# Patient Record
Sex: Male | Born: 1940 | Race: White | Hispanic: No | Marital: Married | Smoking: Former smoker
Health system: Southern US, Community
[De-identification: ages and names within clinical notes are randomized; demographics above are authoritative.]

## PROBLEM LIST (undated history)

## (undated) DIAGNOSIS — C801 Malignant (primary) neoplasm, unspecified: Secondary | ICD-10-CM

---

## 2004-03-10 ENCOUNTER — Ambulatory Visit: Payer: Self-pay | Admitting: Family Medicine

## 2007-03-28 ENCOUNTER — Ambulatory Visit: Payer: Self-pay | Admitting: Internal Medicine

## 2016-09-24 ENCOUNTER — Other Ambulatory Visit: Payer: Self-pay | Admitting: Family Medicine

## 2016-09-24 DIAGNOSIS — I499 Cardiac arrhythmia, unspecified: Secondary | ICD-10-CM | POA: Insufficient documentation

## 2016-09-24 DIAGNOSIS — J301 Allergic rhinitis due to pollen: Secondary | ICD-10-CM | POA: Insufficient documentation

## 2016-09-24 DIAGNOSIS — W57XXXA Bitten or stung by nonvenomous insect and other nonvenomous arthropods, initial encounter: Secondary | ICD-10-CM | POA: Insufficient documentation

## 2016-09-24 DIAGNOSIS — Z87891 Personal history of nicotine dependence: Secondary | ICD-10-CM

## 2016-09-24 DIAGNOSIS — Z136 Encounter for screening for cardiovascular disorders: Secondary | ICD-10-CM | POA: Insufficient documentation

## 2016-09-27 DIAGNOSIS — J61 Pneumoconiosis due to asbestos and other mineral fibers: Secondary | ICD-10-CM | POA: Insufficient documentation

## 2016-09-27 DIAGNOSIS — Z87891 Personal history of nicotine dependence: Secondary | ICD-10-CM | POA: Insufficient documentation

## 2016-09-27 DIAGNOSIS — C44300 Unspecified malignant neoplasm of skin of unspecified part of face: Secondary | ICD-10-CM | POA: Insufficient documentation

## 2016-09-29 DIAGNOSIS — E786 Lipoprotein deficiency: Secondary | ICD-10-CM | POA: Insufficient documentation

## 2016-10-05 ENCOUNTER — Ambulatory Visit
Admission: RE | Admit: 2016-10-05 | Discharge: 2016-10-05 | Disposition: A | Discharge status: Home / Self Care | Payer: Medicare Other | Source: Ambulatory Visit | Attending: Family Medicine | Admitting: Family Medicine

## 2016-10-05 DIAGNOSIS — Z87891 Personal history of nicotine dependence: Secondary | ICD-10-CM | POA: Insufficient documentation

## 2016-10-05 DIAGNOSIS — I700 Atherosclerosis of aorta: Secondary | ICD-10-CM | POA: Insufficient documentation

## 2016-10-18 ENCOUNTER — Other Ambulatory Visit: Payer: Self-pay | Admitting: Specialist

## 2016-10-18 DIAGNOSIS — R0602 Shortness of breath: Secondary | ICD-10-CM

## 2016-10-22 ENCOUNTER — Ambulatory Visit
Admission: RE | Admit: 2016-10-22 | Discharge: 2016-10-22 | Disposition: A | Discharge status: Home / Self Care | Payer: Medicare Other | Source: Ambulatory Visit | Attending: Specialist | Admitting: Specialist

## 2016-10-22 DIAGNOSIS — K8020 Calculus of gallbladder without cholecystitis without obstruction: Secondary | ICD-10-CM | POA: Diagnosis not present

## 2016-10-22 DIAGNOSIS — R918 Other nonspecific abnormal finding of lung field: Secondary | ICD-10-CM | POA: Insufficient documentation

## 2016-10-22 DIAGNOSIS — J479 Bronchiectasis, uncomplicated: Secondary | ICD-10-CM | POA: Diagnosis not present

## 2016-10-22 DIAGNOSIS — R0602 Shortness of breath: Secondary | ICD-10-CM

## 2017-05-20 ENCOUNTER — Other Ambulatory Visit: Payer: Self-pay | Admitting: Specialist

## 2017-05-20 DIAGNOSIS — J920 Pleural plaque with presence of asbestos: Secondary | ICD-10-CM

## 2017-05-20 DIAGNOSIS — R918 Other nonspecific abnormal finding of lung field: Secondary | ICD-10-CM

## 2017-09-26 ENCOUNTER — Ambulatory Visit
Admission: RE | Admit: 2017-09-26 | Discharge: 2017-09-26 | Disposition: A | Discharge status: Home / Self Care | Payer: Medicare Other | Source: Ambulatory Visit | Attending: Specialist | Admitting: Specialist

## 2017-09-26 ENCOUNTER — Encounter (INDEPENDENT_AMBULATORY_CARE_PROVIDER_SITE_OTHER): Payer: Self-pay

## 2017-09-26 DIAGNOSIS — R918 Other nonspecific abnormal finding of lung field: Secondary | ICD-10-CM | POA: Diagnosis present

## 2017-09-26 DIAGNOSIS — R911 Solitary pulmonary nodule: Secondary | ICD-10-CM | POA: Insufficient documentation

## 2017-09-26 DIAGNOSIS — J479 Bronchiectasis, uncomplicated: Secondary | ICD-10-CM | POA: Insufficient documentation

## 2017-09-26 DIAGNOSIS — I700 Atherosclerosis of aorta: Secondary | ICD-10-CM | POA: Diagnosis not present

## 2017-09-26 DIAGNOSIS — I2510 Atherosclerotic heart disease of native coronary artery without angina pectoris: Secondary | ICD-10-CM | POA: Diagnosis not present

## 2017-09-26 DIAGNOSIS — J920 Pleural plaque with presence of asbestos: Secondary | ICD-10-CM | POA: Diagnosis not present

## 2018-06-27 DIAGNOSIS — Z85828 Personal history of other malignant neoplasm of skin: Secondary | ICD-10-CM | POA: Insufficient documentation

## 2019-07-02 ENCOUNTER — Other Ambulatory Visit: Payer: Self-pay | Admitting: Specialist

## 2019-07-02 DIAGNOSIS — R0609 Other forms of dyspnea: Secondary | ICD-10-CM

## 2019-07-02 DIAGNOSIS — J61 Pneumoconiosis due to asbestos and other mineral fibers: Secondary | ICD-10-CM

## 2019-07-02 DIAGNOSIS — J449 Chronic obstructive pulmonary disease, unspecified: Secondary | ICD-10-CM

## 2019-07-24 ENCOUNTER — Ambulatory Visit
Admission: RE | Admit: 2019-07-24 | Discharge: 2019-07-24 | Disposition: A | Discharge status: Home / Self Care | Payer: Medicare Other | Source: Ambulatory Visit | Attending: Specialist | Admitting: Specialist

## 2019-07-24 ENCOUNTER — Other Ambulatory Visit: Payer: Self-pay

## 2019-07-24 DIAGNOSIS — J61 Pneumoconiosis due to asbestos and other mineral fibers: Secondary | ICD-10-CM | POA: Insufficient documentation

## 2019-07-24 DIAGNOSIS — J449 Chronic obstructive pulmonary disease, unspecified: Secondary | ICD-10-CM | POA: Insufficient documentation

## 2019-07-24 DIAGNOSIS — R0609 Other forms of dyspnea: Secondary | ICD-10-CM

## 2019-07-24 DIAGNOSIS — R0600 Dyspnea, unspecified: Secondary | ICD-10-CM | POA: Insufficient documentation

## 2019-07-27 ENCOUNTER — Inpatient Hospital Stay: Payer: Medicare Other

## 2019-07-27 ENCOUNTER — Inpatient Hospital Stay: Payer: Medicare Other | Attending: Oncology | Admitting: Oncology

## 2019-07-27 ENCOUNTER — Other Ambulatory Visit: Payer: Self-pay

## 2019-07-27 ENCOUNTER — Encounter: Payer: Self-pay | Admitting: Oncology

## 2019-07-27 VITALS — BP 195/84 | HR 57 | Temp 96.4°F | Ht 67.0 in | Wt 201.0 lb

## 2019-07-27 DIAGNOSIS — R590 Localized enlarged lymph nodes: Secondary | ICD-10-CM | POA: Insufficient documentation

## 2019-07-27 DIAGNOSIS — J61 Pneumoconiosis due to asbestos and other mineral fibers: Secondary | ICD-10-CM | POA: Diagnosis not present

## 2019-07-27 DIAGNOSIS — J449 Chronic obstructive pulmonary disease, unspecified: Secondary | ICD-10-CM | POA: Insufficient documentation

## 2019-07-27 DIAGNOSIS — C8519 Unspecified B-cell lymphoma, extranodal and solid organ sites: Secondary | ICD-10-CM | POA: Diagnosis not present

## 2019-07-27 DIAGNOSIS — R9389 Abnormal findings on diagnostic imaging of other specified body structures: Secondary | ICD-10-CM

## 2019-07-27 DIAGNOSIS — R918 Other nonspecific abnormal finding of lung field: Secondary | ICD-10-CM | POA: Diagnosis present

## 2019-07-27 NOTE — Progress Notes (Signed)
Patient is here today to establish care. Patient stated that he had been getting SOB on exertion.

## 2019-08-01 ENCOUNTER — Encounter: Payer: Self-pay | Admitting: Oncology

## 2019-08-01 NOTE — Progress Notes (Signed)
Hematology/Oncology Consult note [facility] [phone] [phone]  Patient Care Team: Lynnell Jude, MD as PCP - General (Family Medicine)   Name of the patient: Lee Freeman  [place]:[id number]  1940/05/24    Reason for referral-abnormal CT chest   Referring physician-Dr. Lanney Gins    Date of visit: 08/01/19   History of presenting illness- Patient is a 79 year old male with past medical history significant for asbestosis.  He was following up with Dr. For that.  He has stage II COPD and changes of asbestosis noted in his CT scans before.  More recently patient underwent CT chest without contrast on 07/24/2019 which showed changes of asbestosis related pleural disease which was stable.  However he was noted to have a new soft tissue nodularity within the abdominal omentum that was new as compared to 2018.  Metastatic disease is not excluded and CT scan was recommended.  Patient is therefore referred to [place] for the same  Patient is doing relatively well for his age.  Reports that his appetite and weight have remained stable.  Denies any abdominal pain.  Patient does not use any baseline oxygen  ECOG PS- 1  Pain scale- 0   Review of systems- Review of Systems  Constitutional: Positive for malaise/fatigue. Negative for chills, fever and weight loss.  HENT: Negative for congestion, ear discharge and nosebleeds.   Eyes: Negative for blurred vision.  Respiratory: Negative for cough, hemoptysis, sputum production, shortness of breath and wheezing.   Cardiovascular: Negative for chest pain, palpitations, orthopnea and claudication.  Gastrointestinal: Negative for abdominal pain, blood in stool, constipation, diarrhea, heartburn, melena, nausea and vomiting.  Genitourinary: Negative for dysuria, flank pain, frequency, hematuria and urgency.  Musculoskeletal: Negative for back pain, joint pain and myalgias.  Skin: Negative for rash.  Neurological:  Negative for dizziness, tingling, focal weakness, seizures, weakness and headaches.  Endo/Heme/Allergies: Does not bruise/bleed easily.  Psychiatric/Behavioral: Negative for depression and suicidal ideas. The patient does not have insomnia.     Allergies  Allergen Reactions  . Amoxicillin Diarrhea    Other reaction(s): Abdominal Pain  . Clindamycin Hcl Diarrhea    Other reaction(s): Abdominal Pain  . Prednisone     Other reaction(s): Hallucination    Patient Active Problem List   Diagnosis Date Noted  . Personal history of other malignant neoplasm of skin 06/27/2018  . Low HDL (under 40) 09/29/2016  . Asbestosis ([place]) 09/27/2016  . Former cigarette smoker 09/27/2016  . Skin cancer of face 09/27/2016  . Irregular heart beat 09/24/2016  . Screening for AAA (abdominal aortic aneurysm) 09/24/2016  . Seasonal allergic rhinitis due to pollen 09/24/2016  . Tick bite 09/24/2016     History reviewed. No pertinent past medical history.   History reviewed. No pertinent surgical history.  Social History   Socioeconomic History  . Marital status: Married    Spouse name: Not on file  . Number of children: Not on file  . Years of education: Not on file  . Highest education level: Not on file  Occupational History  . Not on file  Tobacco Use  . Smoking status: Former Smoker    Packs/day: 1.00    Years: 20.00    Pack years: 20.00    Types: Cigarettes    Quit date: 01/04/1970    Years since quitting: 49.6  . Smokeless tobacco: Former Systems developer    Types: [place] date: 01/04/1970  Substance and Sexual Activity  . Alcohol use:  Not Currently  . Drug use: Not on file  . Sexual activity: Not on file  Other Topics Concern  . Not on file  Social History Narrative  . Not on file   Social Determinants of Health   Financial Resource Strain:   . Difficulty of Paying Living Expenses:   Food Insecurity:   . Worried About Charity fundraiser in the Last Year:   . Arboriculturist in  the Last Year:   Transportation Needs:   . Film/video editor (Medical):   Marland Kitchen Lack of Transportation (Non-Medical):   Physical Activity:   . Days of Exercise per Week:   . Minutes of Exercise per Session:   Stress:   . Feeling of Stress :   Social Connections:   . Frequency of Communication with Friends and Family:   . Frequency of Social Gatherings with Friends and Family:   . Attends Religious Services:   . Active Member of Clubs or Organizations:   . Attends Archivist Meetings:   Marland Kitchen Marital Status:   Intimate Partner Violence:   . Fear of Current or Ex-Partner:   . Emotionally Abused:   Marland Kitchen Physically Abused:   . Sexually Abused:      Family History  Problem Relation Age of Onset  . Colon cancer Mother      Current Outpatient Medications:  .  fexofenadine (ALLEGRA) 180 MG tablet, Take 1 tablet by mouth as needed., Disp: , Rfl:  .  fluticasone (FLONASE) 50 MCG/ACT nasal spray, Place 2 sprays into the nose once as needed., Disp: , Rfl:  .  umeclidinium-vilanterol (ANORO ELLIPTA) 62.5-25 MCG/INH AEPB, Inhale 1 puff into the lungs 1 day or 1 dose., Disp: , Rfl:    Physical exam:  Vitals:   07/27/19 1050  BP: (!) 195/84  Pulse: (!) 57  Temp: (!) 96.4 F (35.8 C)  TempSrc: Tympanic  SpO2: 100%  Weight: 201 lb (91.2 kg)  Height: 5\' 7"  (1.702 m)   Physical Exam HENT:     Head: Normocephalic and atraumatic.  Eyes:     Pupils: Pupils are equal, round, and reactive to light.  Cardiovascular:     Rate and Rhythm: Normal rate and regular rhythm.     Heart sounds: Normal heart sounds.  Pulmonary:     Effort: Pulmonary effort is normal.     Breath sounds: Normal breath sounds.  Abdominal:     General: Bowel sounds are normal. There is no distension.     Palpations: Abdomen is soft.     Tenderness: There is no abdominal tenderness.  Musculoskeletal:     Cervical back: Normal range of motion.  Skin:    General: Skin is warm and dry.  Neurological:      Mental Status: He is alert and oriented to person, place, and time.        No flowsheet data found. No flowsheet data found.  No images are attached to the encounter.  CT CHEST WO CONTRAST  Result Date: 07/24/2019 CLINICAL DATA:  Dyspnea on exertion. History of asbestosis. COPD. EXAM: CT CHEST WITHOUT CONTRAST TECHNIQUE: Multidetector CT imaging of the chest was performed following the standard protocol without IV contrast. COMPARISON:  09/26/2017 FINDINGS: Cardiovascular: Advanced aortic and branch vessel atherosclerosis. Mild cardiomegaly, without pericardial effusion. Multivessel coronary artery atherosclerosis. Aortic valve calcification. Mediastinum/Nodes: Calcified mediastinal nodes are likely related to old granulomatous disease. Hilar regions poorly evaluated without intravenous contrast. Lungs/Pleura: No pleural fluid. Redemonstration of bilateral  primarily calcified pleural plaques. Right hemidiaphragm eventration laterally. Moderate centrilobular emphysema. Scattered calcified granulomas. Mild nodularity just anterior to a calcified granuloma in the posterior right upper lobe on 35/3 similar and can be presumed benign. Redemonstration of areas of mild bibasilar ground-glass opacity, architectural distortion, and traction bronchiectasis/bronchiolectasis. Upper Abdomen: Multiple gallstones. 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 Normal imaged portions of the spleen, stomach, adrenal glands, right kidney. Fatty replacement throughout the pancreas. Soft tissue nodularity within the omentum including at 1.1 cm on 143/2. This area was not imaged on the most recent exam, but is new since 10/22/2016. Gastrohepatic ligament node measures 1.0 cm on 134/2 and is similar to on the prior. Musculoskeletal: Moderate upper and midthoracic spondylosis. IMPRESSION: 1.Asbestos related pleural disease and asbestosis, as before. 2. Soft tissue nodularity within the abdominal omentum, new since 10/22/2016. Cannot  exclude metastatic disease from an unknown primary. Consider dedicated abdominal pelvic CT. 3. Cholelithiasis 4. Aortic atherosclerosis (ICD10-I70.0), coronary artery atherosclerosis and emphysema (ICD10-J43.9). These results will be called to the ordering clinician or representative by the Radiologist Assistant, and communication documented in the PACS or [employer]. Electronically Signed   By: Abigail Miyamoto M.D.   On: 07/24/2019 14:02    Assessment and plan- Patient is a 79 y.o. male referred for abnormal CT chest  I have reviewed CT chest images independently and discussed findings with the patient.  Patient noted to have changes of asbestosis which have relatively remained stable.  However there is new nodularity seen within the abdominal omentum and it is unclear if this is malignancy or some other etiology.  I will proceed with a CT abdomen pelvis with contrast at this time.  I will review his case at tumor board the following week and discuss about next steps based on the discussion  Patient and his wife verbalized understanding of the plan   Thank you for this kind referral and the opportunity to participate in the care of this  Patient   Visit Diagnosis 1. Abnormal CT scan, chest     Dr. Randa Evens, MD, MPH [facility] [id number] 08/01/2019

## 2019-08-03 ENCOUNTER — Inpatient Hospital Stay: Payer: Medicare Other

## 2019-08-03 ENCOUNTER — Other Ambulatory Visit: Payer: Self-pay

## 2019-08-03 ENCOUNTER — Other Ambulatory Visit: Payer: Self-pay | Admitting: Oncology

## 2019-08-03 ENCOUNTER — Ambulatory Visit
Admission: RE | Admit: 2019-08-03 | Discharge: 2019-08-03 | Disposition: A | Discharge status: Home / Self Care | Payer: Medicare Other | Source: Ambulatory Visit | Attending: Oncology | Admitting: Oncology

## 2019-08-03 DIAGNOSIS — K668 Other specified disorders of peritoneum: Secondary | ICD-10-CM

## 2019-08-03 DIAGNOSIS — R9389 Abnormal findings on diagnostic imaging of other specified body structures: Secondary | ICD-10-CM | POA: Diagnosis not present

## 2019-08-03 DIAGNOSIS — C8519 Unspecified B-cell lymphoma, extranodal and solid organ sites: Secondary | ICD-10-CM | POA: Diagnosis not present

## 2019-08-03 HISTORY — DX: Malignant (primary) neoplasm, unspecified: C80.1

## 2019-08-03 LAB — COMPREHENSIVE METABOLIC PANEL
ALT: 39 U/L (ref 0–44)
AST: 30 U/L (ref 15–41)
Albumin: 4.1 g/dL (ref 3.5–5.0)
Alkaline Phosphatase: 92 U/L (ref 38–126)
Anion gap: 8 (ref 5–15)
BUN: 16 mg/dL (ref 8–23)
CO2: 25 mmol/L (ref 22–32)
Calcium: 9 mg/dL (ref 8.9–10.3)
Chloride: 105 mmol/L (ref 98–111)
Creatinine, Ser: 1.07 mg/dL (ref 0.61–1.24)
GFR calc Af Amer: >60 mL/min (ref >60)
GFR calc non Af Amer: >60 mL/min (ref >60)
Glucose, Bld: 124 mg/dL — ABNORMAL HIGH (ref 70–99)
Potassium: 3.9 mmol/L (ref 3.5–5.1)
Sodium: 138 mmol/L (ref 135–145)
Total Bilirubin: 0.6 mg/dL (ref 0.3–1.2)
Total Protein: 7.4 g/dL (ref 6.5–8.1)

## 2019-08-03 LAB — CBC
HCT: 42.5 % (ref 39.0–52.0)
Hemoglobin: 14.8 g/dL (ref 13.0–17.0)
MCH: 31 pg (ref 26.0–34.0)
MCHC: 34.8 g/dL (ref 30.0–36.0)
MCV: 88.9 fL (ref 80.0–100.0)
Platelets: 215 10*3/uL (ref 150–400)
RBC: 4.78 MIL/uL (ref 4.22–5.81)
RDW: 12.8 % (ref 11.5–15.5)
WBC: 5.4 10*3/uL (ref 4.0–10.5)
nRBC: 0 % (ref 0.0–0.2)

## 2019-08-03 MED ORDER — IOHEXOL 300 MG/ML  SOLN
100.0000 mL | Freq: Once | INTRAMUSCULAR | Status: AC | PRN
  Administered 2019-08-03: 100 mL via INTRAVENOUS

## 2019-08-06 ENCOUNTER — Other Ambulatory Visit: Payer: Self-pay

## 2019-08-07 ENCOUNTER — Encounter: Payer: Self-pay | Admitting: Oncology

## 2019-08-07 NOTE — Progress Notes (Signed)
Spoke with Rose patients wife and she is wanting [place] to get the procedure scheduled for Omental Mass CT BIopsy

## 2019-08-09 ENCOUNTER — Other Ambulatory Visit: Payer: Medicare Other

## 2019-08-09 ENCOUNTER — Other Ambulatory Visit: Payer: Self-pay

## 2019-08-09 ENCOUNTER — Inpatient Hospital Stay (HOSPITAL_BASED_OUTPATIENT_CLINIC_OR_DEPARTMENT_OTHER): Payer: Medicare Other | Admitting: Oncology

## 2019-08-09 VITALS — BP 203/92 | HR 63 | Temp 97.8°F | Ht 67.0 in | Wt 191.0 lb

## 2019-08-09 DIAGNOSIS — K668 Other specified disorders of peritoneum: Secondary | ICD-10-CM

## 2019-08-09 DIAGNOSIS — Z7189 Other specified counseling: Secondary | ICD-10-CM

## 2019-08-09 DIAGNOSIS — C8519 Unspecified B-cell lymphoma, extranodal and solid organ sites: Secondary | ICD-10-CM | POA: Diagnosis not present

## 2019-08-09 NOTE — Progress Notes (Signed)
Patient on schedule for omental biopsy 08/15/2019, spoke with wife on phone with instructions to be here @ 0800, NPO after MN prior to procedure and driver for home post recovery/procedure. Stated understanding.

## 2019-08-09 NOTE — Progress Notes (Signed)
Tumor Board Documentation  Lee Freeman was presented by Dr Janese Banks at our Tumor Board on 08/09/2019, which included representatives from medical oncology, radiation oncology, navigation, pathology, radiology, surgical, surgical oncology, internal medicine, genetics, palliative care, research.  Lee Freeman currently presents as a new patient, for discussion with history of the following treatments: active survellience.  Additionally, we reviewed previous medical and familial history, history of present illness, and recent lab results along with all available histopathologic and imaging studies. The tumor board considered available treatment options and made the following recommendations: Biopsy(CT biopsy)    The following procedures/referrals were also placed: No orders of the defined types were placed in this encounter.   Clinical Trial Status: not discussed   Staging used: Not Applicable  National site-specific guidelines   were discussed with respect to the case.  Tumor board is a meeting of clinicians from various specialty areas who evaluate and discuss patients for whom a multidisciplinary approach is being considered. Final determinations in the plan of care are those of the provider(s). The responsibility for follow up of recommendations given during tumor board is that of the provider.   Today's extended care, comprehensive team conference, Lee Freeman was not present for the discussion and was not examined.   Multidisciplinary Tumor Board is a multidisciplinary case peer review process.  Decisions discussed in the Multidisciplinary Tumor Board reflect the opinions of the specialists present at the conference without having examined the patient.  Ultimately, treatment and diagnostic decisions rest with the primary provider(s) and the patient.

## 2019-08-09 NOTE — Progress Notes (Signed)
Patient is here today to get his CT Scan results.

## 2019-08-10 ENCOUNTER — Telehealth: Payer: Self-pay

## 2019-08-10 DIAGNOSIS — Z7189 Other specified counseling: Secondary | ICD-10-CM | POA: Insufficient documentation

## 2019-08-10 NOTE — Progress Notes (Signed)
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  He was following up with Dr. For that.  He has stage II COPD and changes of asbestosis noted in his CT scans before.  More recently patient underwent CT chest without contrast on 07/24/2019 which showed changes of asbestosis related pleural disease which was stable.  However he was noted to have a new soft tissue nodularity within the abdominal omentum that was new as compared to 2018.  Metastatic disease is not excluded and CT scan was recommended.    Patient has never had a colonoscopy.  He is a Sales promotion account executive Witness  CT abdomen pelvis with contrast showed extensive omental/peritoneal nodularity consistent with metastatic disease of unknown primary.  Equivocal colonic wall thickening of the transverse colon and adenopathy within the subtending mesocolon probably suggestive of colon primary.  Interval history-patient reports a poor appetite and occasional pain in his abdomen as well as diarrhea.  He has lost 10 pounds in the last 2 weeks.  ECOG PS- 1 Pain scale- 0   Review of systems- Review of Systems  Constitutional: Positive for malaise/fatigue and weight loss. Negative for chills and fever.  HENT: Negative for congestion, ear discharge and nosebleeds.   Eyes: Negative for blurred vision.  Respiratory: Negative for cough, hemoptysis, sputum production, shortness of breath and wheezing.   Cardiovascular: Negative for chest pain, palpitations, orthopnea and claudication.    Gastrointestinal: Positive for diarrhea. Negative for abdominal pain, blood in stool, constipation, heartburn, melena, nausea and vomiting.  Genitourinary: Negative for dysuria, flank pain, frequency, hematuria and urgency.  Musculoskeletal: Negative for back pain, joint pain and myalgias.  Skin: Negative for rash.  Neurological: Negative for dizziness, tingling, focal weakness, seizures, weakness and headaches.  Endo/Heme/Allergies: Does not bruise/bleed easily.  Psychiatric/Behavioral: Negative for depression and suicidal ideas. The patient does not have insomnia.       Allergies  Allergen Reactions  . Amoxicillin Diarrhea    Other reaction(s): Abdominal Pain  . Clindamycin Hcl Diarrhea    Other reaction(s): Abdominal Pain  . Prednisone     Other reaction(s): Hallucination     Past Medical History:  Diagnosis Date  . Cancer [facility])      History reviewed. No pertinent surgical history.  Social History   Socioeconomic History  . Marital status: Married    Spouse name: Not on file  . Number of children: Not on file  . Years of education: Not on file  . Highest education level: Not on file  Occupational History  . Not on file  Tobacco Use  . Smoking status: Former Smoker    Packs/day: 1.00    Years: 20.00    Pack years: 20.00    Types: Cigarettes    Quit date: 01/04/1970    Years since quitting: 49.6  . Smokeless tobacco: Former Systems developer    Types: [place] date: 01/04/1970  Substance and Sexual Activity  . Alcohol use: Not Currently  . Drug use: Not on file  . Sexual activity: Not on file  Other Topics Concern  . Not on  file  Social History Narrative  . Not on file   Social Determinants of Health   Financial Resource Strain:   . Difficulty of Paying Living Expenses:   Food Insecurity:   . Worried About Charity fundraiser in the Last Year:   . Arboriculturist in the Last Year:   Transportation Needs:   . Film/video editor (Medical):   Marland Kitchen Lack of  Transportation (Non-Medical):   Physical Activity:   . Days of Exercise per Week:   . Minutes of Exercise per Session:   Stress:   . Feeling of Stress :   Social Connections:   . Frequency of Communication with Friends and Family:   . Frequency of Social Gatherings with Friends and Family:   . Attends Religious Services:   . Active Member of Clubs or Organizations:   . Attends Archivist Meetings:   Marland Kitchen Marital Status:   Intimate Partner Violence:   . Fear of Current or Ex-Partner:   . Emotionally Abused:   Marland Kitchen Physically Abused:   . Sexually Abused:     Family History  Problem Relation Age of Onset  . Colon cancer Mother      Current Outpatient Medications:  .  fexofenadine (ALLEGRA) 180 MG tablet, Take 1 tablet by mouth as needed., Disp: , Rfl:  .  fluticasone (FLONASE) 50 MCG/ACT nasal spray, Place 2 sprays into the nose once as needed., Disp: , Rfl:  .  umeclidinium-vilanterol (ANORO ELLIPTA) 62.5-25 MCG/INH AEPB, Inhale 1 puff into the lungs 1 day or 1 dose., Disp: , Rfl:   Physical exam:  Vitals:   08/09/19 0933  BP: (!) 203/92  Pulse: 63  Temp: 97.8 F (36.6 C)  TempSrc: Tympanic  Weight: 191 lb (86.6 kg)  Height: 5\' 7"  (1.702 m)   Physical Exam Constitutional:      General: He is not in acute distress. Cardiovascular:     Rate and Rhythm: Normal rate and regular rhythm.     Heart sounds: Normal heart sounds.  Pulmonary:     Effort: Pulmonary effort is normal.     Breath sounds: Normal breath sounds.  Abdominal:     General: Bowel sounds are normal.     Palpations: Abdomen is soft.  Skin:    General: Skin is warm and dry.  Neurological:     Mental Status: He is alert and oriented to person, place, and time.      CMP Latest Ref Rng & Units 08/03/2019  Glucose 70 - 99 mg/dL 124(H)  BUN 8 - 23 mg/dL 16  Creatinine 0.61 - 1.24 mg/dL 1.07  Sodium 135 - 145 mmol/L 138  Potassium 3.5 - 5.1 mmol/L 3.9  Chloride 98 - 111 mmol/L 105  CO2 22 - 32  mmol/L 25  Calcium 8.9 - 10.3 mg/dL 9.0  Total Protein 6.5 - 8.1 g/dL 7.4  Total Bilirubin 0.3 - 1.2 mg/dL 0.6  Alkaline Phos 38 - 126 U/L 92  AST 15 - 41 U/L 30  ALT 0 - 44 U/L 39   CBC Latest Ref Rng & Units 08/03/2019  WBC 4.0 - 10.5 K/uL 5.4  Hemoglobin 13.0 - 17.0 g/dL 14.8  Hematocrit 39.0 - 52.0 % 42.5  Platelets 150 - 400 K/uL 215    No images are attached to the encounter.  CT CHEST WO CONTRAST  Result Date: 07/24/2019 CLINICAL DATA:  Dyspnea on exertion. History of asbestosis. COPD. EXAM: CT CHEST WITHOUT CONTRAST TECHNIQUE: Multidetector CT  imaging of the chest was performed following the standard protocol without IV contrast. COMPARISON:  09/26/2017 FINDINGS: Cardiovascular: Advanced aortic and branch vessel atherosclerosis. Mild cardiomegaly, without pericardial effusion. Multivessel coronary artery atherosclerosis. Aortic valve calcification. Mediastinum/Nodes: Calcified mediastinal nodes are likely related to old granulomatous disease. Hilar regions poorly evaluated without intravenous contrast. Lungs/Pleura: No pleural fluid. Redemonstration of bilateral primarily calcified pleural plaques. Right hemidiaphragm eventration laterally. Moderate centrilobular emphysema. Scattered calcified granulomas. Mild nodularity just anterior to a calcified granuloma in the posterior right upper lobe on 35/3 similar and can be presumed benign. Redemonstration of areas of mild bibasilar ground-glass opacity, architectural distortion, and traction bronchiectasis/bronchiolectasis. Upper Abdomen: Multiple gallstones. 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 Normal imaged portions of the spleen, stomach, adrenal glands, right kidney. Fatty replacement throughout the pancreas. Soft tissue nodularity within the omentum including at 1.1 cm on 143/2. This area was not imaged on the most recent exam, but is new since 10/22/2016. Gastrohepatic ligament node measures 1.0 cm on 134/2 and is similar to on the  prior. Musculoskeletal: Moderate upper and midthoracic spondylosis. IMPRESSION: 1.Asbestos related pleural disease and asbestosis, as before. 2. Soft tissue nodularity within the abdominal omentum, new since 10/22/2016. Cannot exclude metastatic disease from an unknown primary. Consider dedicated abdominal pelvic CT. 3. Cholelithiasis 4. Aortic atherosclerosis (ICD10-I70.0), coronary artery atherosclerosis and emphysema (ICD10-J43.9). These results will be called to the ordering clinician or representative by the Radiologist Assistant, and communication documented in the PACS or [employer]. Electronically Signed   By: Abigail Miyamoto M.D.   On: 07/24/2019 14:02   CT ABDOMEN PELVIS W CONTRAST  Result Date: 08/03/2019 CLINICAL DATA:  Chest CT demonstrating soft tissue nodularity within the omentum. Left-sided abdominal pain for the past month. Intermittent diarrhea for several years. History of skin cancer. Asbestosis. EXAM: CT ABDOMEN AND PELVIS WITH CONTRAST TECHNIQUE: Multidetector CT imaging of the abdomen and pelvis was performed using the standard protocol following bolus administration of intravenous contrast. CONTRAST:  146mL OMNIPAQUE IOHEXOL 300 MG/ML  SOLN COMPARISON:  No prior abdominal imaging. Chest CT of 07/24/2019 reviewed. FINDINGS: Lower chest: Lung emphysema. Calcified pleural plaques. Bibasilar calcified granulomas. Mild cardiomegaly, with multivessel coronary artery atherosclerosis. Hepatobiliary: Old granulomatous disease in the liver. Multiple small gallstones without acute cholecystitis or biliary duct dilatation. Pancreas: Fatty replacement throughout the pancreas. No duct dilatation or acute inflammation. Spleen: Splenic calcifications are likely related to old granulomatous disease. Adrenals/Urinary Tract: Normal adrenal glands. Mild renal cortical thinning bilaterally. Underdistended bladder. Stomach/Bowel: Normal stomach, without wall thickening. Descending duodenal diverticulum.  Otherwise normal small bowel. Equivocal wall thickening involving the transverse colon, immediately posterior to the dominant omental mass detailed below. Example 32/2. Normal terminal ileum and appendix. Vascular/Lymphatic: Advanced aortic and branch vessel atherosclerosis. The infrarenal IVC is diminutive with calcifications along its course, including on 45/2. Left periaortic retroperitoneal node is enlarged but retains its fatty hilum a 1.5 cm on 40/2. Just anterior to this, a periaortic node measures 9 mm on 40/2. Probable prominent right pelvic nodes, cephalad to the seminal vesicles including at 1.3 cm on 69/2. A node within the transverse mesocolon measures 1.1 cm on 32/2. Reproductive: Mild prostatomegaly. Other: No significant free fluid. Dominant omental mass, caudal to what was imaged on the prior chest CT. Example at 5.5 x 2.9 cm on 36/2. The omental nodule detailed on the prior chest CT is again identified at 8 mm on 25/2. Small bilateral fat containing inguinal hernias. Musculoskeletal: Degenerative fusion of the bilateral sacroiliac joints. Thoracolumbar spondylosis. IMPRESSION:  1. 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 Favor transverse colonic primary, given subjacent equivocal colonic wall thickening and adenopathy within the subtending mesocolon. 2. Cholelithiasis. 3. Aortic Atherosclerosis (ICD10-I70.0). More equivocal adenopathy within the abdominal retroperitoneum and pelvis as detailed above. 4. 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 Cannot exclude segmental presumably nonocclusive chronic thrombosis. Suboptimally evaluated on this nondedicated exam. Electronically Signed   By: Abigail Miyamoto M.D.   On: 08/03/2019 14:40     Assessment and plan- Patient is a 79 y.o. male with omental metastases and intra-abdominal adenopathy of unknown primary here to discuss further management  I have reviewed CT abdomen and  pelvis images independently and I have showed the images to the patient and his wife as well.  CT abdomen shows multiple omental masses as well as intra-abdominal adenopathy which is concerning for metastatic disease of unknown primary.  There is equivocal wall thickening noted of the transverse colon and colonic primary is a possibility.  Patient has never had a colonoscopy in the past.  I have discussed patient's case with Dr. Kathlene Cote from interventional radiology and a CT-guided biopsy of the omental mass has been deemed to be feasible.  That would be a good starting step.  Patient is leaning against a colonoscopy to begin with.  I will tentatively see him in 2 weeks time after the biopsy results are back to discuss further management.  Discussed with patient and wife that given the fact that we are seeing multiple omental masses this likely represents stage IV disease.  Treatment will be palliative and not curative in the setting but what type of treatment can be offered will depend on the results of the biopsy  Uncontrolled hypertension: We will reach out to his primary care doctor regarding this   Visit Diagnosis 1. Mass of omentum   2. Goals of care, counseling/discussion      Dr. Randa Evens, MD, MPH [facility] [id number] 08/10/2019 12:50 PM

## 2019-08-10 NOTE — Telephone Encounter (Signed)
Called Dr. Eula Flax office and left them a voicemail letting know about patient's blood pressure. I am also faxing them patient's last two visits that he had been here with Dr. Janese Banks so they could see his readings. I left them my name andnumber in case they had further questions.

## 2019-08-13 ENCOUNTER — Ambulatory Visit: Payer: Medicare Other

## 2019-08-14 ENCOUNTER — Other Ambulatory Visit: Payer: Self-pay | Admitting: Student

## 2019-08-15 ENCOUNTER — Ambulatory Visit: Payer: Medicare Other

## 2019-08-15 ENCOUNTER — Other Ambulatory Visit: Payer: Self-pay

## 2019-08-15 ENCOUNTER — Ambulatory Visit
Admission: RE | Admit: 2019-08-15 | Discharge: 2019-08-15 | Disposition: A | Discharge status: Home / Self Care | Payer: Medicare Other | Source: Ambulatory Visit | Attending: Oncology | Admitting: Oncology

## 2019-08-15 DIAGNOSIS — I2510 Atherosclerotic heart disease of native coronary artery without angina pectoris: Secondary | ICD-10-CM | POA: Insufficient documentation

## 2019-08-15 DIAGNOSIS — Z880 Allergy status to penicillin: Secondary | ICD-10-CM | POA: Diagnosis not present

## 2019-08-15 DIAGNOSIS — Z87891 Personal history of nicotine dependence: Secondary | ICD-10-CM | POA: Insufficient documentation

## 2019-08-15 DIAGNOSIS — R519 Headache, unspecified: Secondary | ICD-10-CM | POA: Insufficient documentation

## 2019-08-15 DIAGNOSIS — R350 Frequency of micturition: Secondary | ICD-10-CM | POA: Diagnosis not present

## 2019-08-15 DIAGNOSIS — C8333 Diffuse large B-cell lymphoma, intra-abdominal lymph nodes: Secondary | ICD-10-CM | POA: Insufficient documentation

## 2019-08-15 DIAGNOSIS — Z888 Allergy status to other drugs, medicaments and biological substances status: Secondary | ICD-10-CM | POA: Diagnosis not present

## 2019-08-15 DIAGNOSIS — Z77090 Contact with and (suspected) exposure to asbestos: Secondary | ICD-10-CM | POA: Diagnosis not present

## 2019-08-15 DIAGNOSIS — K668 Other specified disorders of peritoneum: Secondary | ICD-10-CM | POA: Diagnosis present

## 2019-08-15 DIAGNOSIS — Z85828 Personal history of other malignant neoplasm of skin: Secondary | ICD-10-CM | POA: Insufficient documentation

## 2019-08-15 DIAGNOSIS — R197 Diarrhea, unspecified: Secondary | ICD-10-CM | POA: Diagnosis not present

## 2019-08-15 DIAGNOSIS — I700 Atherosclerosis of aorta: Secondary | ICD-10-CM | POA: Diagnosis not present

## 2019-08-15 DIAGNOSIS — N401 Enlarged prostate with lower urinary tract symptoms: Secondary | ICD-10-CM | POA: Insufficient documentation

## 2019-08-15 MED ORDER — MIDAZOLAM HCL 5 MG/5ML IJ SOLN
INTRAMUSCULAR | Status: AC | PRN
  Administered 2019-08-15: 1 mg via INTRAVENOUS

## 2019-08-15 MED ORDER — MIDAZOLAM HCL 5 MG/5ML IJ SOLN
INTRAMUSCULAR | Status: AC
  Filled 2019-08-15: qty 5

## 2019-08-15 MED ORDER — HYDRALAZINE HCL 20 MG/ML IJ SOLN
5.0000 mg | Freq: Once | INTRAMUSCULAR | Status: AC
  Filled 2019-08-15: qty 0.25

## 2019-08-15 MED ORDER — FENTANYL CITRATE (PF) 100 MCG/2ML IJ SOLN
INTRAMUSCULAR | Status: AC | PRN
  Administered 2019-08-15: 50 ug via INTRAVENOUS

## 2019-08-15 MED ORDER — SODIUM CHLORIDE 0.9 % IV SOLN: INTRAVENOUS | Status: DC

## 2019-08-15 MED ORDER — HYDRALAZINE HCL 20 MG/ML IJ SOLN
10.0000 mg | Freq: Once | INTRAMUSCULAR | Status: AC
  Filled 2019-08-15: qty 0.5

## 2019-08-15 MED ORDER — HYDRALAZINE HCL 20 MG/ML IJ SOLN
INTRAMUSCULAR | Status: AC
  Administered 2019-08-15: 10 mg via INTRAVENOUS
  Filled 2019-08-15: qty 1

## 2019-08-15 MED ORDER — HYDRALAZINE HCL 20 MG/ML IJ SOLN
INTRAMUSCULAR | Status: AC
  Administered 2019-08-15: 5 mg via INTRAVENOUS
  Filled 2019-08-15: qty 1

## 2019-08-15 MED ORDER — FENTANYL CITRATE (PF) 250 MCG/5ML IJ SOLN
INTRAMUSCULAR | Status: AC
  Filled 2019-08-15: qty 5

## 2019-08-15 NOTE — Progress Notes (Signed)
Dr. Anselm Pancoast at bedside; made aware of consistently high BP. 2nd dose of hydralazine ordered and given IV (see MAR). Pt. Remains asymptomatic.

## 2019-08-15 NOTE — H&P (Signed)
Chief Complaint: Patient was seen in consultation today for CT-guided omental biopsy at the request of Rao,Archana C  Referring Physician(s): Rao,Archana C  Patient Status: [facility] - Out-pt  History of Present Illness: Lee Freeman is a 79 y.o. male with history of asbestos exposure and noted to have soft tissue nodularity in the abdomen on a chest CT.  Subsequent CT of the abdomen/pelvis demonstrated extensive omental nodularity and suggestive for neoplastic disease.  Patient needs a tissue diagnosis.  Patient complains of of chronic shortness of breath and persistent low energy.  He reports occasional headaches with visual changes.  Headaches and visual changes improve with hydration.  Good appetite and no significant weight change.  Patient has urinary frequency and gets up every 2 hours at night.  Patient has intermittent diarrhea.  Past Medical History:  Diagnosis Date  . Cancer [facility])    Skin cancer    History reviewed. No pertinent surgical history.  Allergies: Amoxicillin, Clindamycin hcl, and Prednisone  Medications: Prior to Admission medications   Medication Sig Start Date End Date Taking? Authorizing Provider  fexofenadine (ALLEGRA) 180 MG tablet Take 1 tablet by mouth as needed. 07/02/19 07/01/20 Yes [provider]  fluticasone (FLONASE) 50 MCG/ACT nasal spray Place 2 sprays into the nose once as needed. 07/02/19 07/01/20 Yes [provider]  umeclidinium-vilanterol (ANORO ELLIPTA) 62.5-25 MCG/INH AEPB Inhale 1 puff into the lungs 1 day or 1 dose. 10/12/16  Yes [provider]     Family History  Problem Relation Age of Onset  . Colon cancer Mother     Social History   Socioeconomic History  . Marital status: Married    Spouse name: Not on file  . Number of children: Not on file  . Years of education: Not on file  . Highest education level: Not on file  Occupational History  . Not on file  Tobacco Use  . Smoking status: Former Smoker      Packs/day: 1.00    Years: 20.00    Pack years: 20.00    Types: Cigarettes    Quit date: 01/04/1970    Years since quitting: 49.6  . Smokeless tobacco: Former Systems developer    Types: [place] date: 01/04/1970  Substance and Sexual Activity  . Alcohol use: Not Currently  . Drug use: Not Currently  . Sexual activity: Not on file  Other Topics Concern  . Not on file  Social History Narrative  . Not on file   Social Determinants of Health   Financial Resource Strain:   . Difficulty of Paying Living Expenses:   Food Insecurity:   . Worried About Charity fundraiser in the Last Year:   . Arboriculturist in the Last Year:   Transportation Needs:   . Film/video editor (Medical):   Marland Kitchen Lack of Transportation (Non-Medical):   Physical Activity:   . Days of Exercise per Week:   . Minutes of Exercise per Session:   Stress:   . Feeling of Stress :   Social Connections:   . Frequency of Communication with Friends and Family:   . Frequency of Social Gatherings with Friends and Family:   . Attends Religious Services:   . Active Member of Clubs or Organizations:   . Attends Archivist Meetings:   Marland Kitchen Marital Status:      Review of Systems  Constitutional: Positive for activity change. Negative for unexpected weight change.  Respiratory: Positive for shortness  of breath.   Cardiovascular: Negative.   Gastrointestinal: Positive for diarrhea.  Genitourinary: Positive for frequency.  Neurological: Positive for headaches.    Vital Signs: BP (!) 203/98 Comment: Dr. Anselm Pancoast aware  Pulse 69   Temp (!) 97.5 F (36.4 C) (Oral)   Resp 12   Ht 5\' 6"  (1.676 m)   Wt 86.6 kg   SpO2 99%   BMI 30.83 kg/m   Physical Exam Vitals reviewed.  Constitutional:      Appearance: Normal appearance. He is not ill-appearing.  Cardiovascular:     Rate and Rhythm: Normal rate and regular rhythm.  Pulmonary:     Effort: Pulmonary effort is normal.     Breath sounds: Normal breath sounds.   Abdominal:     General: Bowel sounds are normal.     Palpations: Abdomen is soft.     Imaging: CT CHEST WO CONTRAST  Result Date: 07/24/2019 CLINICAL DATA:  Dyspnea on exertion. History of asbestosis. COPD. EXAM: CT CHEST WITHOUT CONTRAST TECHNIQUE: Multidetector CT imaging of the chest was performed following the standard protocol without IV contrast. COMPARISON:  09/26/2017 FINDINGS: Cardiovascular: Advanced aortic and branch vessel atherosclerosis. Mild cardiomegaly, without pericardial effusion. Multivessel coronary artery atherosclerosis. Aortic valve calcification. Mediastinum/Nodes: Calcified mediastinal nodes are likely related to old granulomatous disease. Hilar regions poorly evaluated without intravenous contrast. Lungs/Pleura: No pleural fluid. Redemonstration of bilateral primarily calcified pleural plaques. Right hemidiaphragm eventration laterally. Moderate centrilobular emphysema. Scattered calcified granulomas. Mild nodularity just anterior to a calcified granuloma in the posterior right upper lobe on 35/3 similar and can be presumed benign. Redemonstration of areas of mild bibasilar ground-glass opacity, architectural distortion, and traction bronchiectasis/bronchiolectasis. Upper Abdomen: Multiple gallstones. 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 Normal imaged portions of the spleen, stomach, adrenal glands, right kidney. Fatty replacement throughout the pancreas. Soft tissue nodularity within the omentum including at 1.1 cm on 143/2. This area was not imaged on the most recent exam, but is new since 10/22/2016. Gastrohepatic ligament node measures 1.0 cm on 134/2 and is similar to on the prior. Musculoskeletal: Moderate upper and midthoracic spondylosis. IMPRESSION: 1.Asbestos related pleural disease and asbestosis, as before. 2. Soft tissue nodularity within the abdominal omentum, new since 10/22/2016. Cannot exclude metastatic disease from an unknown primary. Consider dedicated  abdominal pelvic CT. 3. Cholelithiasis 4. Aortic atherosclerosis (ICD10-I70.0), coronary artery atherosclerosis and emphysema (ICD10-J43.9). These results will be called to the ordering clinician or representative by the Radiologist Assistant, and communication documented in the PACS or [employer]. Electronically Signed   By: Abigail Miyamoto M.D.   On: 07/24/2019 14:02   CT ABDOMEN PELVIS W CONTRAST  Result Date: 08/03/2019 CLINICAL DATA:  Chest CT demonstrating soft tissue nodularity within the omentum. Left-sided abdominal pain for the past month. Intermittent diarrhea for several years. History of skin cancer. Asbestosis. EXAM: CT ABDOMEN AND PELVIS WITH CONTRAST TECHNIQUE: Multidetector CT imaging of the abdomen and pelvis was performed using the standard protocol following bolus administration of intravenous contrast. CONTRAST:  130mL OMNIPAQUE IOHEXOL 300 MG/ML  SOLN COMPARISON:  No prior abdominal imaging. Chest CT of 07/24/2019 reviewed. FINDINGS: Lower chest: Lung emphysema. Calcified pleural plaques. Bibasilar calcified granulomas. Mild cardiomegaly, with multivessel coronary artery atherosclerosis. Hepatobiliary: Old granulomatous disease in the liver. Multiple small gallstones without acute cholecystitis or biliary duct dilatation. Pancreas: Fatty replacement throughout the pancreas. No duct dilatation or acute inflammation. Spleen: Splenic calcifications are likely related to old granulomatous disease. Adrenals/Urinary Tract: Normal adrenal glands. Mild renal cortical thinning bilaterally. Underdistended  bladder. Stomach/Bowel: Normal stomach, without wall thickening. Descending duodenal diverticulum. Otherwise normal small bowel. Equivocal wall thickening involving the transverse colon, immediately posterior to the dominant omental mass detailed below. Example 32/2. Normal terminal ileum and appendix. Vascular/Lymphatic: Advanced aortic and branch vessel atherosclerosis. The infrarenal IVC is  diminutive with calcifications along its course, including on 45/2. Left periaortic retroperitoneal node is enlarged but retains its fatty hilum a 1.5 cm on 40/2. Just anterior to this, a periaortic node measures 9 mm on 40/2. Probable prominent right pelvic nodes, cephalad to the seminal vesicles including at 1.3 cm on 69/2. A node within the transverse mesocolon measures 1.1 cm on 32/2. Reproductive: Mild prostatomegaly. Other: No significant free fluid. Dominant omental mass, caudal to what was imaged on the prior chest CT. Example at 5.5 x 2.9 cm on 36/2. The omental nodule detailed on the prior chest CT is again identified at 8 mm on 25/2. Small bilateral fat containing inguinal hernias. Musculoskeletal: Degenerative fusion of the bilateral sacroiliac joints. Thoracolumbar spondylosis. IMPRESSION: 1. 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 Favor transverse colonic primary, given subjacent equivocal colonic wall thickening and adenopathy within the subtending mesocolon. 2. Cholelithiasis. 3. Aortic Atherosclerosis (ICD10-I70.0). More equivocal adenopathy within the abdominal retroperitoneum and pelvis as detailed above. 4. 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 Cannot exclude segmental presumably nonocclusive chronic thrombosis. Suboptimally evaluated on this nondedicated exam. Electronically Signed   By: Abigail Miyamoto M.D.   On: 08/03/2019 14:40    Labs:  CBC: Recent Labs    08/03/19 0938  WBC 5.4  HGB 14.8  HCT 42.5  PLT 215    COAGS: No results for input(s): INR, APTT in the last 8760 hours.  BMP: Recent Labs    08/03/19 0938  NA 138  K 3.9  CL 105  CO2 25  GLUCOSE 124*  BUN 16  CALCIUM 9.0  CREATININE 1.07  GFRNONAA >60  GFRAA >60    LIVER FUNCTION TESTS: Recent Labs    08/03/19 0938  BILITOT 0.6  AST 30  ALT 39  ALKPHOS 92  PROT 7.4  ALBUMIN 4.1    TUMOR MARKERS: No results for  input(s): AFPTM, CEA, CA199, CHROMGRNA in the last 8760 hours.  Assessment and Plan:  79 year old with omental thickening and nodularity.  Findings are concerning for a neoplastic process and patient needs a tissue diagnosis.  Reviewed the CT from 08/03/2019 and the large omental mass is amenable for CT-guided biopsy.  Discussed CT-guided biopsy of the omental mass with moderate sedation.  Patient is hypertensive and we will follow blood pressures while the patient is here for the procedure.  Patient does not take any blood pressure medications.  Risks and benefits of omental mass biopsy was discussed with the patient and/or patient's family including, but not limited to bleeding, infection, damage to adjacent structures or low yield requiring additional tests. All of the questions were answered and there is agreement to proceed.  Consent signed and in chart.   Thank you for this interesting consult.  I greatly enjoyed meeting Lee Freeman and look forward to participating in their care.  A copy of this report was sent to the requesting provider on this date.  Electronically Signed: Burman Riis, MD 08/15/2019, 8:36 AM   I spent a total of  15 Minutes   in face to face in clinical consultation, greater than 50% of which was counseling/coordinating care for CT-guided omental biopsy

## 2019-08-15 NOTE — Procedures (Signed)
Interventional Radiology Procedure:   Indications: Omental mass  Procedure: CT guided omental biopsy  Findings: 4 cores from omental mass.  2 cores in formalin and 2 cores on Telfa pad with saline  Complications: None     EBL: less than 10 ml  Plan: Bedrest 2 hours   Lee Nazario R. Anselm Pancoast, MD  Pager: [phone]

## 2019-08-20 ENCOUNTER — Encounter: Payer: Self-pay | Admitting: Oncology

## 2019-08-23 ENCOUNTER — Inpatient Hospital Stay (HOSPITAL_BASED_OUTPATIENT_CLINIC_OR_DEPARTMENT_OTHER): Payer: Medicare Other | Admitting: Oncology

## 2019-08-23 ENCOUNTER — Other Ambulatory Visit: Payer: Self-pay | Admitting: Oncology

## 2019-08-23 ENCOUNTER — Other Ambulatory Visit: Payer: Self-pay

## 2019-08-23 ENCOUNTER — Encounter: Payer: Self-pay | Admitting: Oncology

## 2019-08-23 VITALS — BP 186/66 | HR 61 | Temp 95.1°F | Resp 16 | Wt 193.9 lb

## 2019-08-23 DIAGNOSIS — Z7189 Other specified counseling: Secondary | ICD-10-CM

## 2019-08-23 DIAGNOSIS — C8519 Unspecified B-cell lymphoma, extranodal and solid organ sites: Secondary | ICD-10-CM | POA: Diagnosis not present

## 2019-08-23 DIAGNOSIS — C8339 Diffuse large B-cell lymphoma, extranodal and solid organ sites: Secondary | ICD-10-CM

## 2019-08-25 DIAGNOSIS — C8339 Diffuse large B-cell lymphoma, extranodal and solid organ sites: Secondary | ICD-10-CM | POA: Insufficient documentation

## 2019-08-25 NOTE — Progress Notes (Signed)
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 He was following up with Dr. For that. He has stage II COPD and changes of asbestosis noted in his CT scans before. More recently patient underwent CT chest without contrast on 07/24/2019 which showed changes of asbestosis related pleural disease which was stable. However he was noted to have a new soft tissue nodularity within the abdominal omentum that was new as compared to 2018. Metastatic disease is not excluded and CT scan was recommended.   Patient has never had a colonoscopy.  He is a Sales promotion account executive [employer]  CT abdomen pelvis with contrast showed extensive omental/peritoneal nodularity consistent with metastatic disease of unknown primary.  Equivocal colonic wall thickening of the transverse colon and adenopathy within the subtending mesocolon probably suggestive of colon primary.  Omental biopsy showed a large B-cell lymphoma ABC subtype.  Double expression of BCL-2 and c-Myc.  FISH testing to determine if this is double hit is currently pending   Interval history-patient reports fatigue and early satiety.  He has lost 8 pounds in the last 1 month.  ECOG PS- 1 Pain scale- 0   Review of systems- Review of Systems  Constitutional: Positive for malaise/fatigue and weight loss. Negative for chills and fever.  HENT: Negative for congestion, ear discharge and nosebleeds.   Eyes: Negative for blurred vision.  Respiratory: Negative for cough,  hemoptysis, sputum production, shortness of breath and wheezing.   Cardiovascular: Negative for chest pain, palpitations, orthopnea and claudication.  Gastrointestinal: Negative for abdominal pain, blood in stool, constipation, diarrhea, heartburn, melena, nausea and vomiting.  Genitourinary: Negative for dysuria, flank pain, frequency, hematuria and urgency.  Musculoskeletal: Negative for back pain, joint pain and myalgias.  Skin: Negative for rash.  Neurological: Negative for dizziness, tingling, focal weakness, seizures, weakness and headaches.  Endo/Heme/Allergies: Does not bruise/bleed easily.  Psychiatric/Behavioral: Negative for depression and suicidal ideas. The patient does not have insomnia.       Allergies  Allergen Reactions  . Amoxicillin Diarrhea    Other reaction(s): Abdominal Pain  . Clindamycin Hcl Diarrhea    Other reaction(s): Abdominal Pain  . Prednisone     Other reaction(s): Hallucination     Past Medical History:  Diagnosis Date  . Cancer [facility])    Skin cancer     History reviewed. No pertinent surgical history.  Social History   Socioeconomic History  . Marital status: Married    Spouse name: Not on file  . Number of children: Not on file  . Years of education: Not on file  . Highest education level: Not on file  Occupational History  . Not on file  Tobacco Use  . Smoking status: Former Smoker    Packs/day: 1.00    Years: 20.00    Pack years: 20.00    Types: Cigarettes    Quit date: 01/04/1970    Years since quitting: 49.6  . Smokeless tobacco: Former Systems developer    Types: [place] date: 01/04/1970  Substance and Sexual Activity  . Alcohol use: Not Currently  .  Drug use: Not Currently  . Sexual activity: Not on file  Other Topics Concern  . Not on file  Social History Narrative  . Not on file   Social Determinants of Health   Financial Resource Strain:   . Difficulty of Paying Living Expenses:   Food Insecurity:   . Worried About  Charity fundraiser in the Last Year:   . Arboriculturist in the Last Year:   Transportation Needs:   . Film/video editor (Medical):   Marland Kitchen Lack of Transportation (Non-Medical):   Physical Activity:   . Days of Exercise per Week:   . Minutes of Exercise per Session:   Stress:   . Feeling of Stress :   Social Connections:   . Frequency of Communication with Friends and Family:   . Frequency of Social Gatherings with Friends and Family:   . Attends Religious Services:   . Active Member of Clubs or Organizations:   . Attends Archivist Meetings:   Marland Kitchen Marital Status:   Intimate Partner Violence:   . Fear of Current or Ex-Partner:   . Emotionally Abused:   Marland Kitchen Physically Abused:   . Sexually Abused:     Family History  Problem Relation Age of Onset  . Colon cancer Mother      Current Outpatient Medications:  .  fexofenadine (ALLEGRA) 180 MG tablet, Take 1 tablet by mouth as needed., Disp: , Rfl:  .  fluticasone (FLONASE) 50 MCG/ACT nasal spray, Place 2 sprays into the nose once as needed., Disp: , Rfl:  .  Multiple Vitamin (MULTIVITAMIN) tablet, Take 1 tablet by mouth daily., Disp: , Rfl:  .  umeclidinium-vilanterol (ANORO ELLIPTA) 62.5-25 MCG/INH AEPB, Inhale 1 puff into the lungs 1 day or 1 dose., Disp: , Rfl:  .  hydrochlorothiazide (HYDRODIURIL) 12.5 MG tablet, Take 12.5 mg by mouth daily., Disp: , Rfl:  .  lisinopril (ZESTRIL) 10 MG tablet, Take 10 mg by mouth daily., Disp: , Rfl:   Physical exam:  Vitals:   08/23/19 1001  BP: (!) 186/66  Pulse: 61  Resp: 16  Temp: (!) 95.1 F (35.1 C)  TempSrc: Tympanic  SpO2: 100%  Weight: 193 lb 14.4 oz (88 kg)   Physical Exam Constitutional:      General: He is not in acute distress. Cardiovascular:     Rate and Rhythm: Normal rate and regular rhythm.     Heart sounds: Normal heart sounds.  Pulmonary:     Effort: Pulmonary effort is normal.     Breath sounds: Normal breath sounds.  Abdominal:     General: Bowel  sounds are normal.     Palpations: Abdomen is soft.  Skin:    General: Skin is warm and dry.  Neurological:     Mental Status: He is alert and oriented to person, place, and time.      CMP Latest Ref Rng & Units 08/03/2019  Glucose 70 - 99 mg/dL 124(H)  BUN 8 - 23 mg/dL 16  Creatinine 0.61 - 1.24 mg/dL 1.07  Sodium 135 - 145 mmol/L 138  Potassium 3.5 - 5.1 mmol/L 3.9  Chloride 98 - 111 mmol/L 105  CO2 22 - 32 mmol/L 25  Calcium 8.9 - 10.3 mg/dL 9.0  Total Protein 6.5 - 8.1 g/dL 7.4  Total Bilirubin 0.3 - 1.2 mg/dL 0.6  Alkaline Phos 38 - 126 U/L 92  AST 15 - 41 U/L 30  ALT 0 - 44 U/L 39  CBC Latest Ref Rng & Units 08/03/2019  WBC 4.0 - 10.5 K/uL 5.4  Hemoglobin 13.0 - 17.0 g/dL 14.8  Hematocrit 39.0 - 52.0 % 42.5  Platelets 150 - 400 K/uL 215    No images are attached to the encounter.  CT ABDOMEN PELVIS W CONTRAST  Result Date: 08/03/2019 CLINICAL DATA:  Chest CT demonstrating soft tissue nodularity within the omentum. Left-sided abdominal pain for the past month. Intermittent diarrhea for several years. History of skin cancer. Asbestosis. EXAM: CT ABDOMEN AND PELVIS WITH CONTRAST TECHNIQUE: Multidetector CT imaging of the abdomen and pelvis was performed using the standard protocol following bolus administration of intravenous contrast. CONTRAST:  156m OMNIPAQUE IOHEXOL 300 MG/ML  SOLN COMPARISON:  No prior abdominal imaging. Chest CT of 07/24/2019 reviewed. FINDINGS: Lower chest: Lung emphysema. Calcified pleural plaques. Bibasilar calcified granulomas. Mild cardiomegaly, with multivessel coronary artery atherosclerosis. Hepatobiliary: Old granulomatous disease in the liver. Multiple small gallstones without acute cholecystitis or biliary duct dilatation. Pancreas: Fatty replacement throughout the pancreas. No duct dilatation or acute inflammation. Spleen: Splenic calcifications are likely related to old granulomatous disease. Adrenals/Urinary Tract: Normal adrenal glands. Mild  renal cortical thinning bilaterally. Underdistended bladder. Stomach/Bowel: Normal stomach, without wall thickening. Descending duodenal diverticulum. Otherwise normal small bowel. Equivocal wall thickening involving the transverse colon, immediately posterior to the dominant omental mass detailed below. Example 32/2. Normal terminal ileum and appendix. Vascular/Lymphatic: Advanced aortic and branch vessel atherosclerosis. The infrarenal IVC is diminutive with calcifications along its course, including on 45/2. Left periaortic retroperitoneal node is enlarged but retains its fatty hilum a 1.5 cm on 40/2. Just anterior to this, a periaortic node measures 9 mm on 40/2. Probable prominent right pelvic nodes, cephalad to the seminal vesicles including at 1.3 cm on 69/2. A node within the transverse mesocolon measures 1.1 cm on 32/2. Reproductive: Mild prostatomegaly. Other: No significant free fluid. Dominant omental mass, caudal to what was imaged on the prior chest CT. Example at 5.5 x 2.9 cm on 36/2. The omental nodule detailed on the prior chest CT is again identified at 8 mm on 25/2. Small bilateral fat containing inguinal hernias. Musculoskeletal: Degenerative fusion of the bilateral sacroiliac joints. Thoracolumbar spondylosis. IMPRESSION: 1. 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 Favor transverse colonic primary, given subjacent equivocal colonic wall thickening and adenopathy within the subtending mesocolon. 2. Cholelithiasis. 3. Aortic Atherosclerosis (ICD10-I70.0). More equivocal adenopathy within the abdominal retroperitoneum and pelvis as detailed above. 4. 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 Cannot exclude segmental presumably nonocclusive chronic thrombosis. Suboptimally evaluated on this nondedicated exam. Electronically Signed   By: KAbigail MiyamotoM.D.   On: 08/03/2019 14:40   CT Biopsy  Result Date:  08/15/2019 INDICATION: 79year old with omental nodularity and concern for neoplasm. Tissue diagnosis is needed. EXAM: CT-GUIDED OMENTAL MASS BIOPSY MEDICATIONS: None. ANESTHESIA/SEDATION: Moderate (conscious) sedation was employed during this procedure. A total of Versed 1.0 mg and Fentanyl 50 mcg was administered intravenously. Moderate Sedation Time: 15 minutes minutes. The patient's level of consciousness and vital signs were monitored continuously by radiology nursing throughout the procedure under my direct supervision. FLUOROSCOPY TIME:  None COMPLICATIONS: None immediate. PROCEDURE: Informed written consent was obtained from the patient after a thorough discussion of the procedural risks, benefits and alternatives. All questions were addressed. A timeout was performed prior to the initiation of the procedure. Patient was placed supine on the CT scanner. CT images through the abdomen were obtained. The omental mass in the anterior abdomen  was identified and targeted. The skin was shaved. The skin was prepped with chlorhexidine and sterile field was created. Skin was anesthetized with 1% lidocaine. A small incision was made. Using CT guidance, 17 gauge coaxial needle was directed into the omental mass. Four core biopsies were obtained with an 18 gauge core device. Two specimens placed in formalin and 2 specimens were placed on a Telfa pad with saline. Needle was removed without complication. Bandage placed over the puncture site. FINDINGS: Again noted is nodularity in the anterior abdomen with an omental mass measuring roughly 6.6 x 3.0 cm. Needle position was confirmed within the lesion. Four core biopsies were obtained. IMPRESSION: CT-guided core biopsies of the omental mass. Electronically Signed   By: Markus Daft M.D.   On: 08/15/2019 12:59     Assessment and plan- Patient is a 79 y.o. male with newly diagnosed large B-cell lymphoma stage IV (diffuse extra contiguous nonlymph node involvement) ABC  subtype with omental involvement without discrete adenopathy  CT chest abdomen and pelvis with contrast did not reveal any evidence of adenopathy or splenic involvement.  He had extensive omental nodularity which makes this stage IV disease due to noncontiguous diffuse extra lymphatic involvement.  At this time I would recommend getting a PET CT scan for staging work-up.  Although bone marrow biopsy would not change management it would be helpful to get a baseline before proceeding with treatment.  We are currently awaiting FISH studies to see if he has double hit B cell lymphoma  At this time outside of clinical trial I would recommend upfront R-CHOP chemotherapy given IV every 3 weeks for 6  cycles if tolerated.  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  Estimated rates of PFS and OS of 5 years with this regimen is 45% and 50% respectively.    Given that he is a Sales promotion account executive [employer], we would be unable to do any blood transfusions should his blood counts dropped during chemotherapy.  Therefore it would be reasonable to give him for cycle of R mini CHOP chemotherapy and see how he tolerates that before proceeding with full dose chemotherapy for cycle 2.  Discussed risks and benefits of chemotherapy including all but not limited to nausea, vomiting, low blood counts, risk of infections and hospitalization.  Risk of peripheral neuropathy associated with vincristine and cardiotoxicity associated with Adriamycin.  He will need a baseline echocardiogram prior to giving R-CHOP treatment.  Treatment will be given with a curative intent.  If he develops significant anemia during treatment I will need to hold his chemotherapy or hold further treatments.  Patient wishes to avoid chemotherapy possible.  Regimen such as Rituxan and Revlimid may be considered if patient  absolutely refuses chemotherapy but not considered to be standard of care except in the scenario.  I will refer him to [facility] for second opinion for his lymphoma.  I will see him back after his PET CT scan results to discuss further management.  Port placement and echocardiogram as well as chemo teach is currently on hold pending second opinion at [facility].  Baseline CMP.  Also need LDH and hepatitis testing prior to starting rituxan.   Total face to face encounter time for this patient visit was 40 min.   Visit Diagnosis 1. Goals of care, counseling/discussion   2. Diffuse large B-cell lymphoma  of extranodal site excluding spleen and other solid organs [employer])      Dr. Randa Evens, MD, MPH [facility] [phone] 08/25/2019 4:40 PM

## 2019-08-28 ENCOUNTER — Telehealth: Payer: Self-pay | Admitting: *Deleted

## 2019-08-28 NOTE — Telephone Encounter (Signed)
Got a call from [place] and pt had an appt for Dr. Baltazar Najjar for May 14 at 3 pm. She did call pt and spoke to wife and she took all the info about appt. They are mailing out a packet of info also. I spoke to Janese Banks and since pt being seen 5/14 with [facility] we will see him sooner. The 5/25 appt is being moved up to 5/18 at 10:30. I called pt and she is agreeable to the appt. Moved up

## 2019-08-29 ENCOUNTER — Ambulatory Visit
Admission: RE | Admit: 2019-08-29 | Discharge: 2019-08-29 | Disposition: A | Discharge status: Home / Self Care | Payer: Medicare Other | Source: Ambulatory Visit | Attending: Oncology | Admitting: Oncology

## 2019-08-29 ENCOUNTER — Other Ambulatory Visit: Payer: Self-pay

## 2019-08-29 DIAGNOSIS — I700 Atherosclerosis of aorta: Secondary | ICD-10-CM | POA: Diagnosis not present

## 2019-08-29 DIAGNOSIS — J948 Other specified pleural conditions: Secondary | ICD-10-CM | POA: Insufficient documentation

## 2019-08-29 DIAGNOSIS — I2510 Atherosclerotic heart disease of native coronary artery without angina pectoris: Secondary | ICD-10-CM | POA: Insufficient documentation

## 2019-08-29 DIAGNOSIS — C8339 Diffuse large B-cell lymphoma, extranodal and solid organ sites: Secondary | ICD-10-CM | POA: Diagnosis not present

## 2019-08-29 LAB — GLUCOSE, CAPILLARY: Glucose-Capillary: 99 mg/dL (ref 70–99)

## 2019-08-29 MED ORDER — FLUDEOXYGLUCOSE F - 18 (FDG) INJECTION
10.0000 | Freq: Once | INTRAVENOUS | Status: AC | PRN
  Administered 2019-08-29: 11:00:00 10.65 via INTRAVENOUS

## 2019-09-10 ENCOUNTER — Telehealth: Payer: Self-pay | Admitting: Oncology

## 2019-09-10 NOTE — Telephone Encounter (Signed)
Error, Incorrect provider was tagged.

## 2019-09-10 NOTE — Telephone Encounter (Signed)
Pt wife called to cancel 5/18 appt for pt. She stated Dr. Janese Banks referred pt to [facility] for clinical trial and he has to go back tomorrow. She did not want to reschedule his appt at this time. Pt was scheduled for PET results.

## 2019-09-11 ENCOUNTER — Encounter: Payer: Self-pay | Admitting: Oncology

## 2019-09-11 ENCOUNTER — Inpatient Hospital Stay: Payer: Medicare Other | Admitting: Oncology

## 2019-09-11 LAB — SURGICAL PATHOLOGY

## 2019-09-18 ENCOUNTER — Ambulatory Visit: Payer: Medicare Other | Admitting: Oncology

## 2020-05-27 DEATH — deceased

## 2021-02-17 IMAGING — CT CT ABD-PELV W/ CM
1 of 3 series · 13 of 32 positions shown, 18 images · IV contrast (APPLIED)
Comparison: No prior abdominal imaging. Chest CT of 07/24/2019
reviewed.

CLINICAL DATA: Chest CT demonstrating soft tissue nodularity within
the omentum. Left-sided abdominal pain for the past month.
Intermittent diarrhea for several years. History of skin cancer.
Asbestosis.

EXAM:
CT ABDOMEN AND PELVIS WITH CONTRAST
TECHNIQUE: Multidetector CT imaging of the abdomen and pelvis was performed
using the standard protocol following bolus administration of
intravenous contrast.
CONTRAST:  100mL OMNIPAQUE IOHEXOL 300 MG/ML  SOLN

[Series 2: axial st · axial · 0.83mm/px · z∈[-676,-256]mm · 13 of 96 slices shown, 18 images]
[im 6/96  soft-tissue]
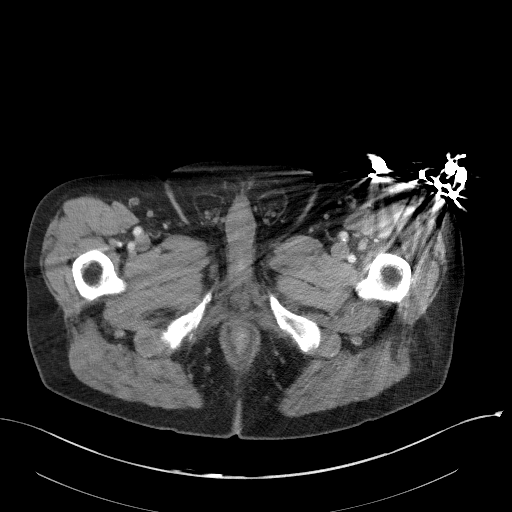
[im 6/96  bone]
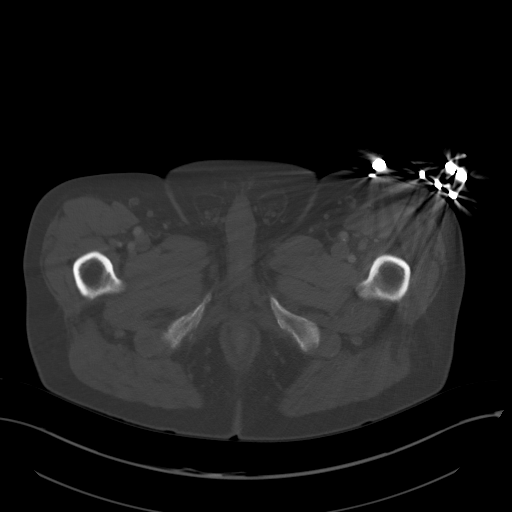
[im 16/96  soft-tissue]
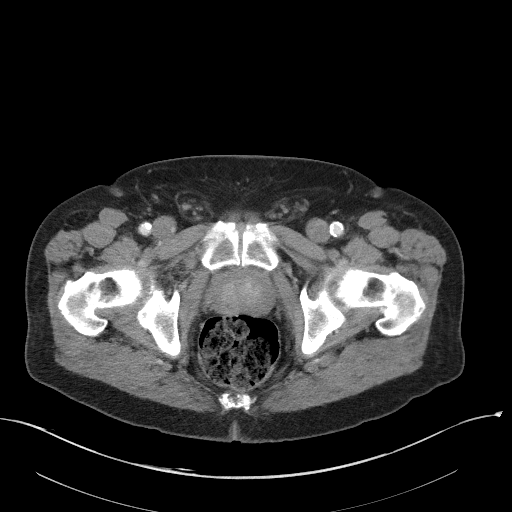
[im 22/96  soft-tissue]
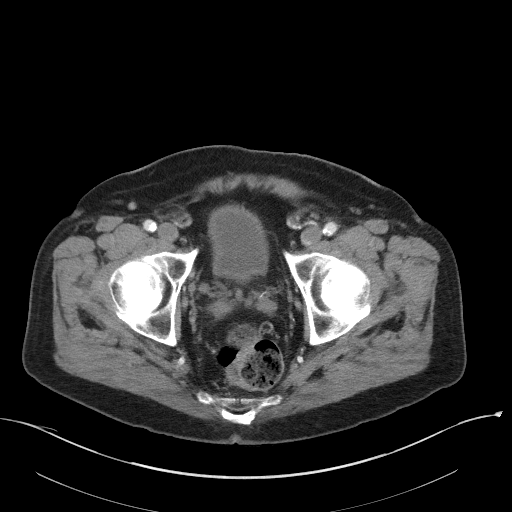
[im 27/96  soft-tissue]
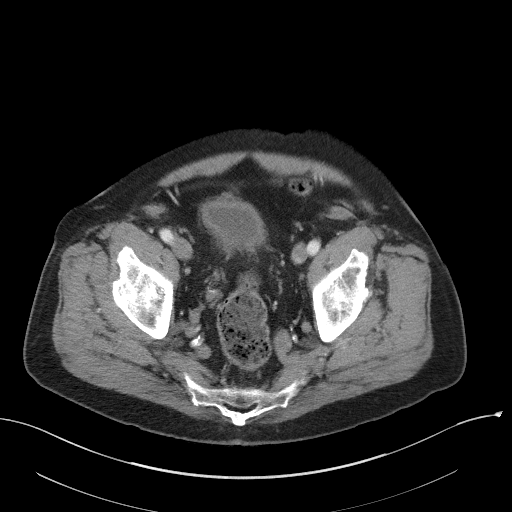
[im 37/96  soft-tissue]
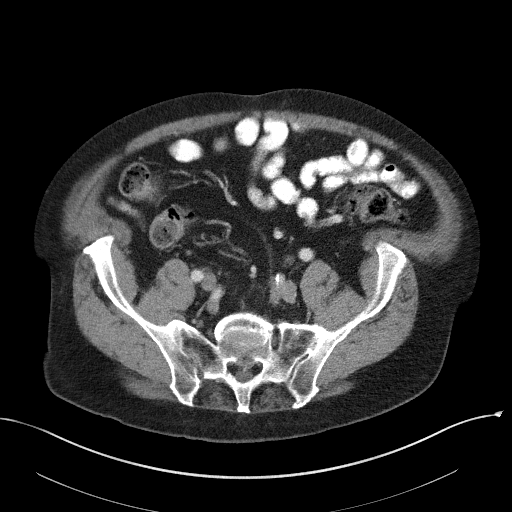
[im 43/96  soft-tissue]
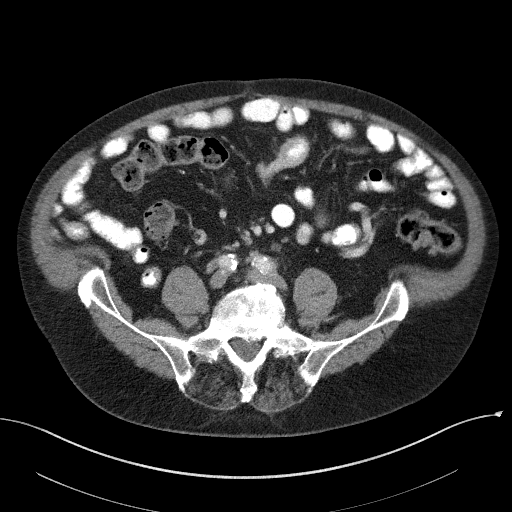
[im 53/96  soft-tissue]
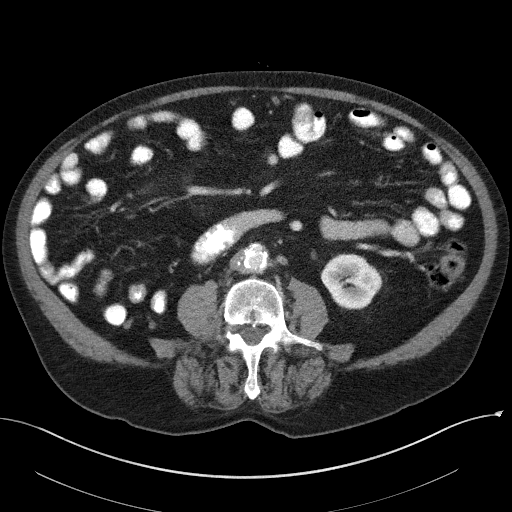
[im 59/96  soft-tissue]
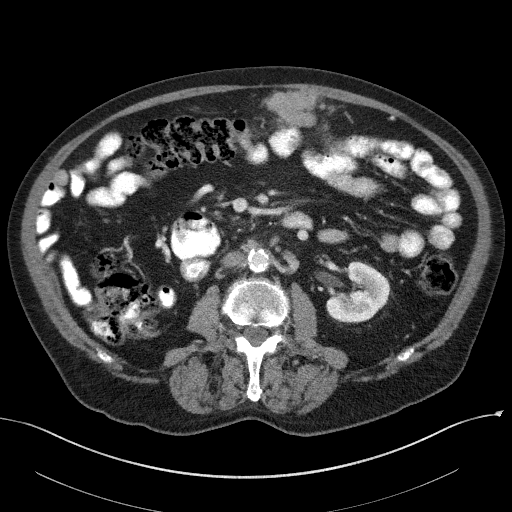
[im 69/96  soft-tissue]
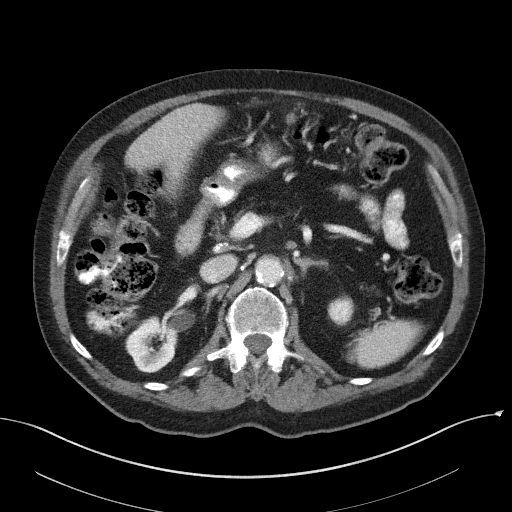
[im 69/96  bone]
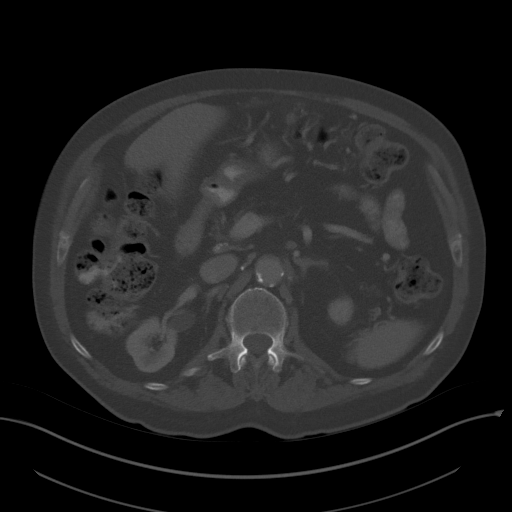
[im 74/96  soft-tissue]
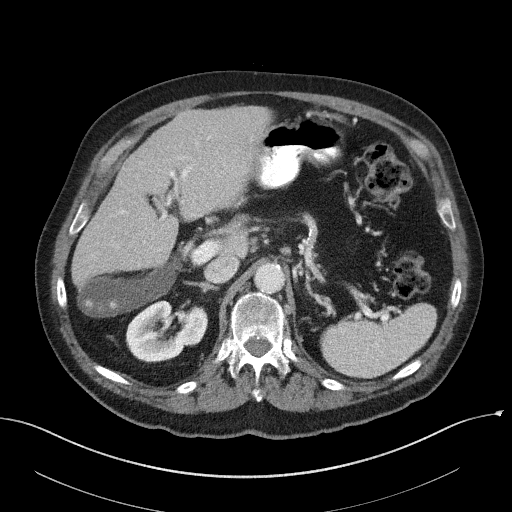
[im 74/96  lung]
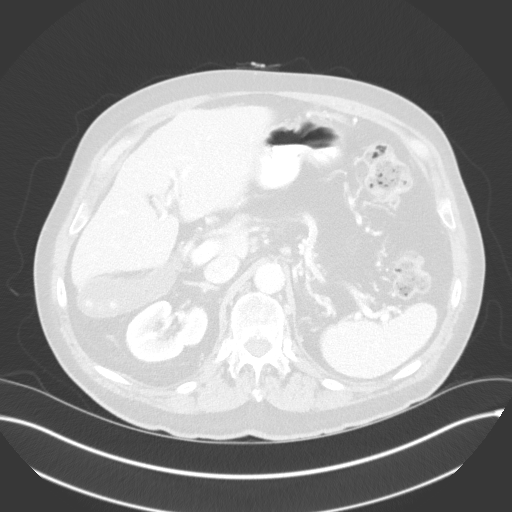
[im 80/96  soft-tissue]
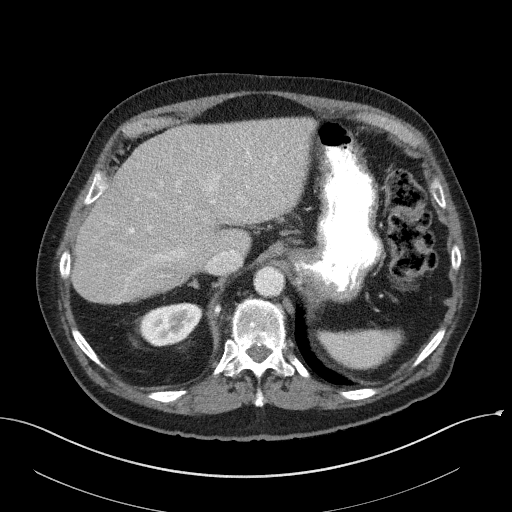
[im 80/96  lung]
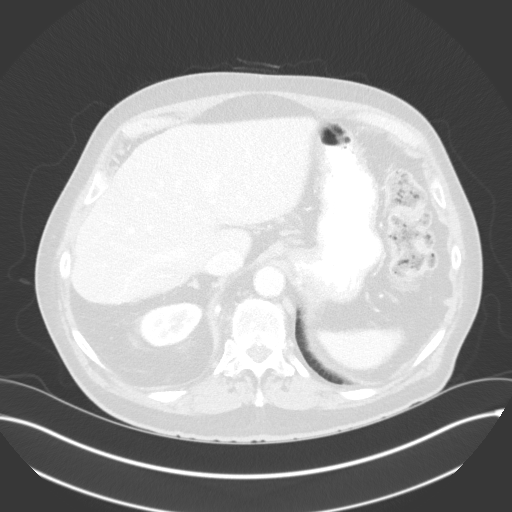
[im 85/96  lung]
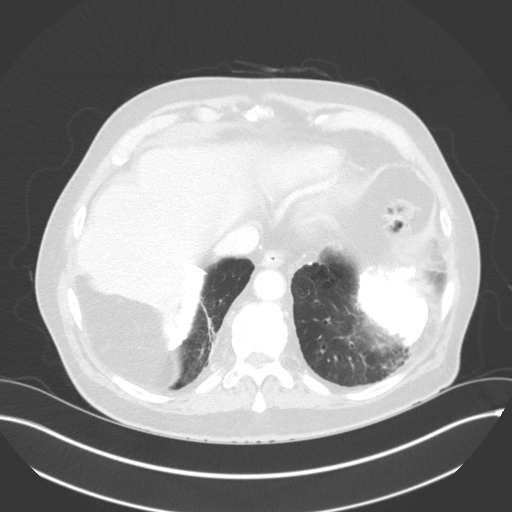
[im 90/96  soft-tissue]
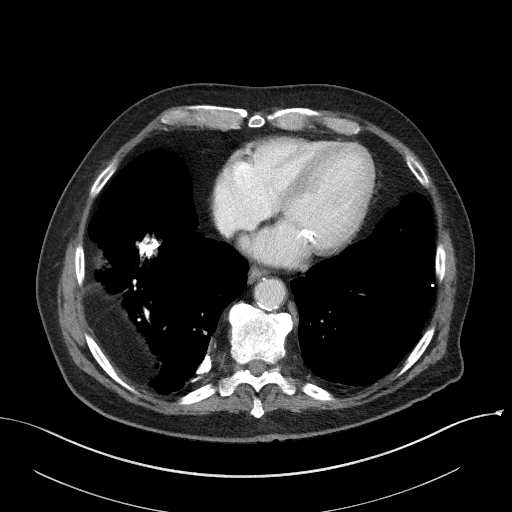
[im 90/96  lung]
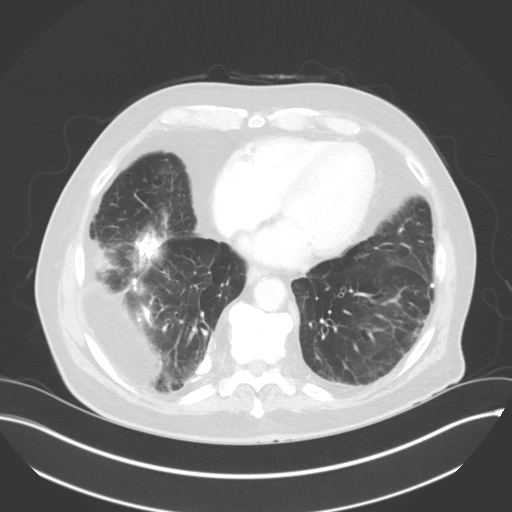

[13 of 32 positions shown; findings below may reference images not displayed]

FINDINGS: Lower chest: Lung emphysema. Calcified pleural plaques. Bibasilar
calcified granulomas. Mild cardiomegaly, with multivessel coronary
artery atherosclerosis.

Hepatobiliary: Old granulomatous disease in the liver. Multiple
small gallstones without acute cholecystitis or biliary duct
dilatation.

Pancreas: Fatty replacement throughout the pancreas. No duct
dilatation or acute inflammation.

Spleen: Splenic calcifications are likely related to old
granulomatous disease.

Adrenals/Urinary Tract: Normal adrenal glands. Mild renal cortical
thinning bilaterally. Underdistended bladder.

Stomach/Bowel: Normal stomach, without wall thickening. Descending
duodenal diverticulum. Otherwise normal small bowel. Equivocal wall
thickening involving the transverse colon, immediately posterior to
the dominant omental mass detailed below. Example 32/2. Normal
terminal ileum and appendix.

Vascular/Lymphatic: Advanced aortic and branch vessel
atherosclerosis. The infrarenal IVC is diminutive with
calcifications along its course, including on 45/2.

Left periaortic retroperitoneal node is enlarged but retains its
fatty hilum a 1.5 cm on 40/2. Just anterior to this, a periaortic
node measures 9 mm on 40/2. Probable prominent right pelvic nodes,
cephalad to the seminal vesicles including at 1.3 cm on 69/2.

A node within the transverse mesocolon measures 1.1 cm on 32/2.

Reproductive: Mild prostatomegaly.

Other: No significant free fluid. Dominant omental mass, caudal to
what was imaged on the prior chest CT. Example at 5.5 x 2.9 cm on
36/2.

The omental nodule detailed on the prior chest CT is again
identified at 8 mm on [DATE]. Small bilateral fat containing inguinal
hernias.

Musculoskeletal: Degenerative fusion of the bilateral sacroiliac
joints. Thoracolumbar spondylosis.
IMPRESSION: 1. Extensive omental/peritoneal nodularity, most consistent with
metastatic disease from an unknown primary. Favor transverse colonic
primary, given subjacent equivocal colonic wall thickening and
adenopathy within the subtending mesocolon.
2. Cholelithiasis.
3. Aortic Atherosclerosis ([id number]). More equivocal adenopathy
within the abdominal retroperitoneum and pelvis as detailed above.
4. Diminutive IVC with calcifications along its course. Cannot
exclude segmental presumably nonocclusive chronic thrombosis.
Suboptimally evaluated on this nondedicated exam.
